# Patient Record
Sex: Female | Born: 2000 | Race: White | Hispanic: No | Marital: Single | Smoking: Never smoker
Health system: Southern US, Community
[De-identification: ages and names within clinical notes are randomized; demographics above are authoritative.]

## PROBLEM LIST (undated history)

## (undated) DIAGNOSIS — F419 Anxiety disorder, unspecified: Secondary | ICD-10-CM

## (undated) DIAGNOSIS — R5600 Simple febrile convulsions: Secondary | ICD-10-CM

---
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
## 2018-11-13 IMAGING — CR DG TOE 5TH 2+V*L*
3 series · 3 of 3 positions shown · non-contrast
Comparison: None.

CLINICAL DATA: Pain and swelling of the distal left fifth toe after
hitting it on a crib.

EXAM:
DG TOE 5TH LEFT

[t toes ap left]
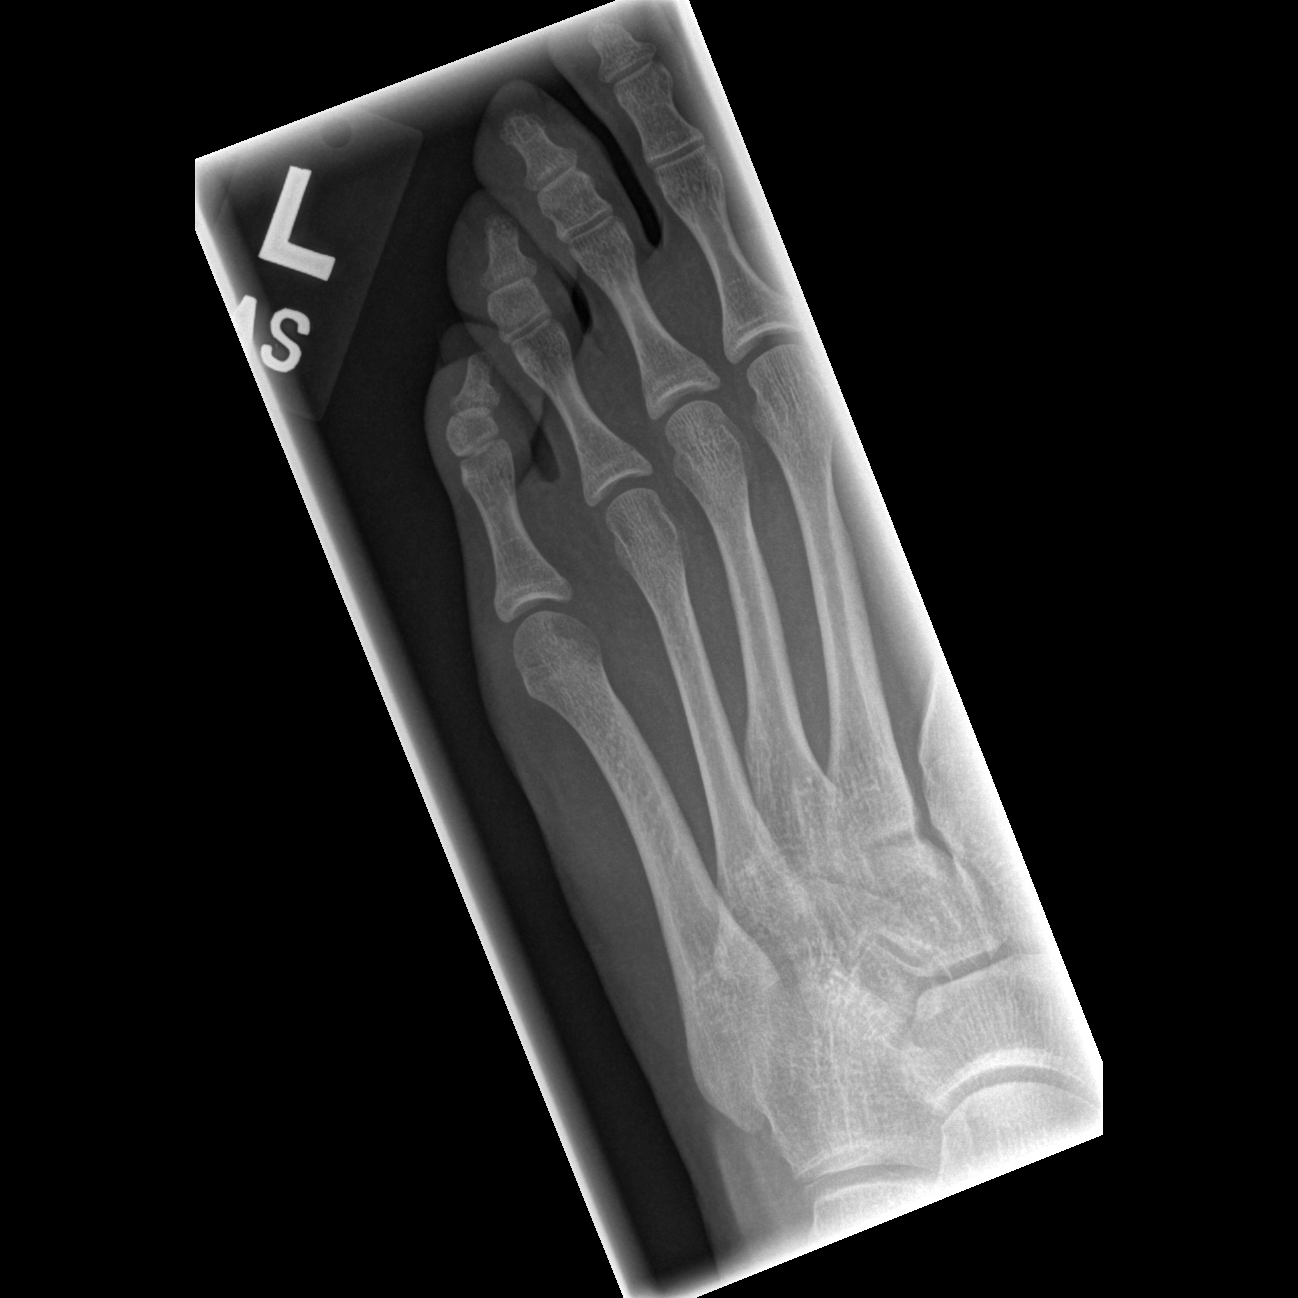

[t toes oblique left]
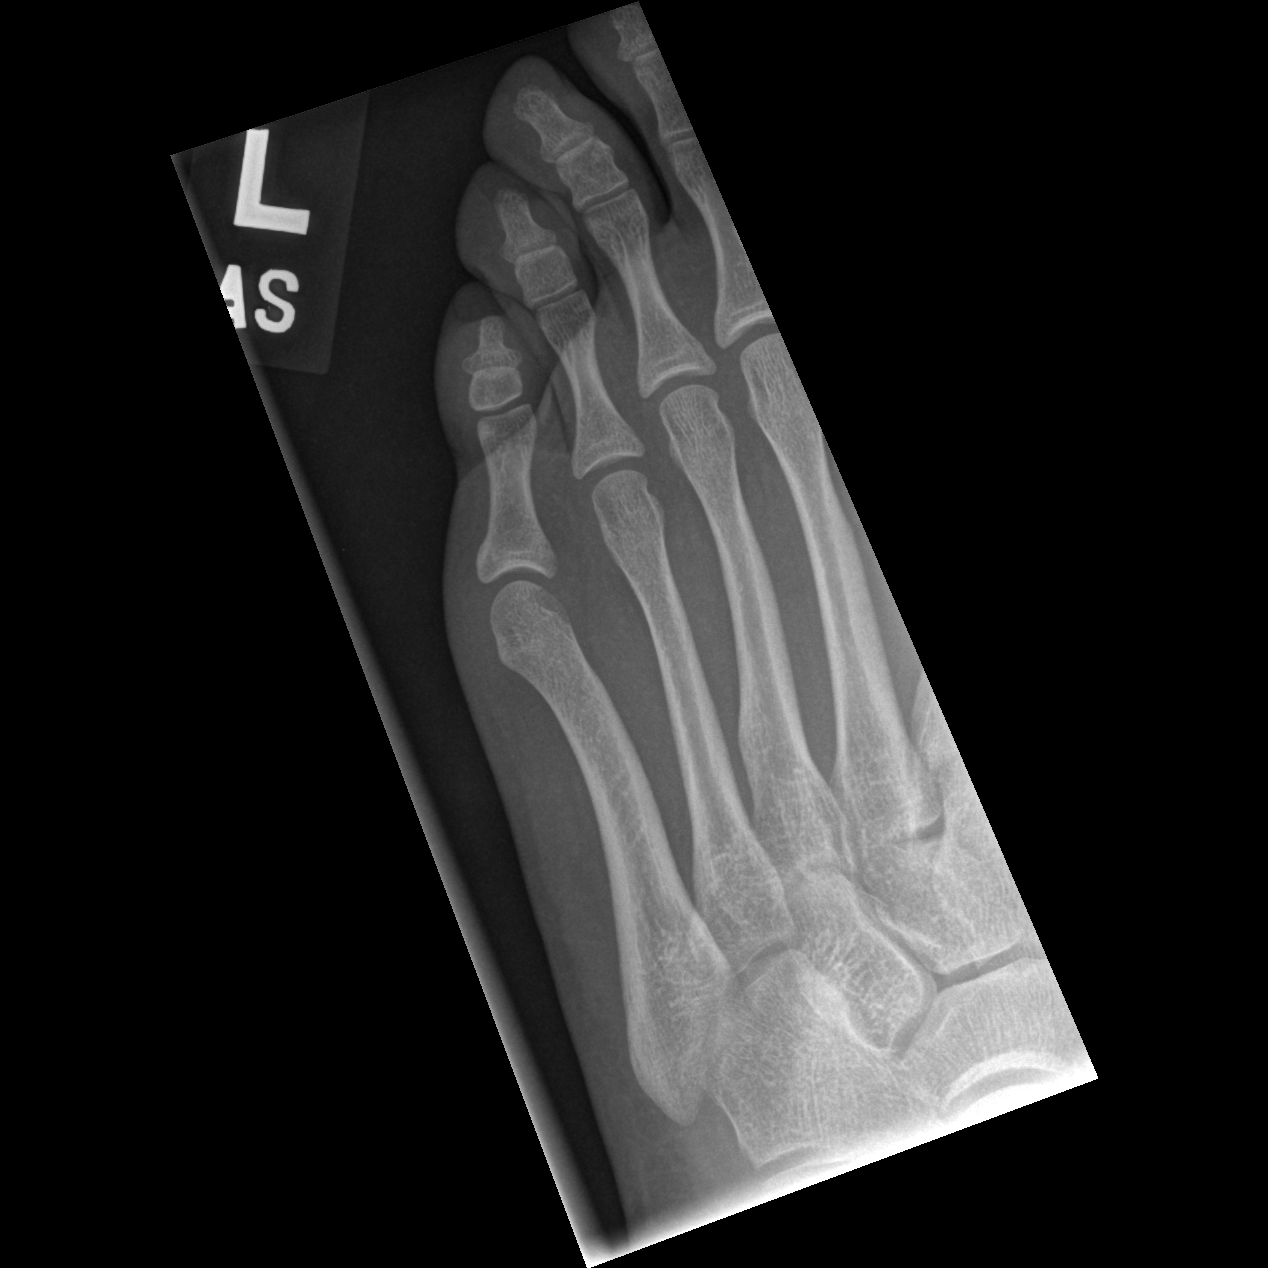

[t toes lateral left]
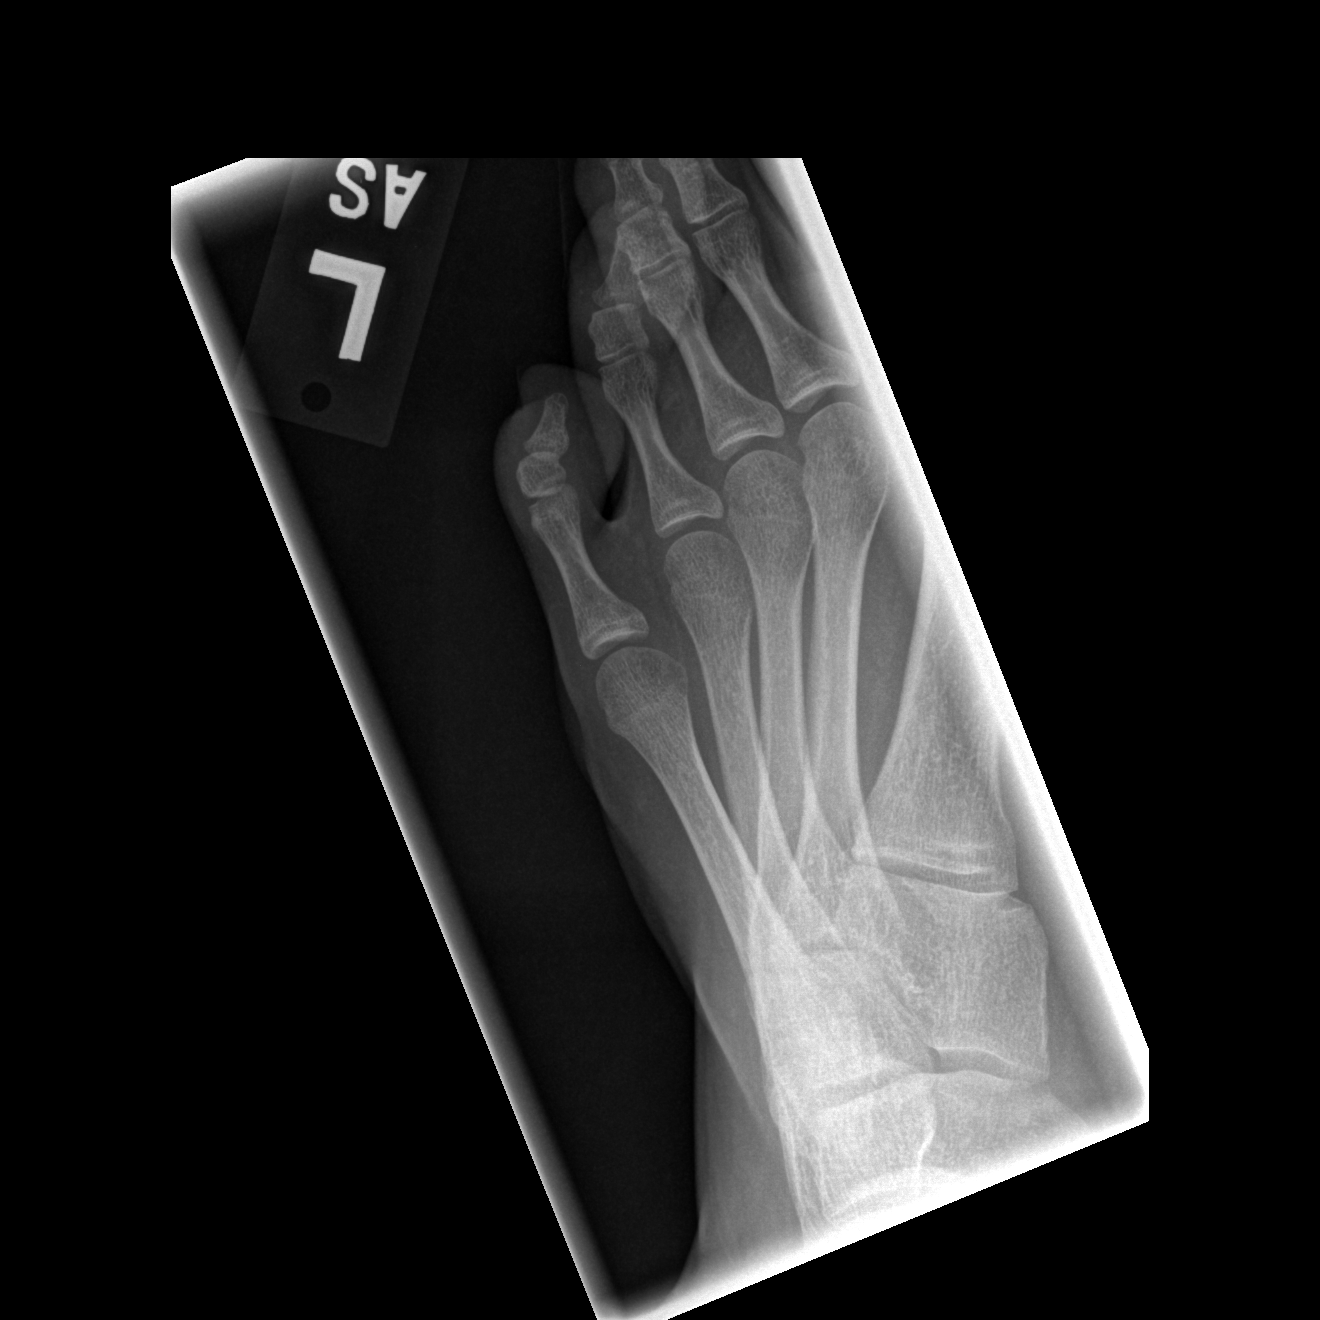

[3 of 3 positions shown; findings below may reference images not displayed]

FINDINGS: There is no evidence of fracture or dislocation. There is no
evidence of arthropathy or other focal bone abnormality. Soft
tissues are unremarkable.
IMPRESSION: Negative.

## 2019-09-16 ENCOUNTER — Ambulatory Visit (HOSPITAL_COMMUNITY)
Admission: EM | Admit: 2019-09-16 | Discharge: 2019-09-16 | Disposition: A | Discharge status: Home / Self Care | Payer: Medicaid Other | Attending: Family Medicine | Admitting: Family Medicine

## 2019-09-16 ENCOUNTER — Encounter (HOSPITAL_COMMUNITY): Payer: Self-pay

## 2019-09-16 ENCOUNTER — Other Ambulatory Visit: Payer: Self-pay

## 2019-09-16 DIAGNOSIS — R070 Pain in throat: Secondary | ICD-10-CM | POA: Diagnosis present

## 2019-09-16 DIAGNOSIS — Z20828 Contact with and (suspected) exposure to other viral communicable diseases: Secondary | ICD-10-CM | POA: Diagnosis not present

## 2019-09-16 DIAGNOSIS — J029 Acute pharyngitis, unspecified: Secondary | ICD-10-CM | POA: Diagnosis not present

## 2019-09-16 DIAGNOSIS — J3489 Other specified disorders of nose and nasal sinuses: Secondary | ICD-10-CM

## 2019-09-16 DIAGNOSIS — R0982 Postnasal drip: Secondary | ICD-10-CM | POA: Diagnosis not present

## 2019-09-16 MED ORDER — PSEUDOEPHEDRINE HCL 30 MG PO TABS: 30.0000 mg | ORAL_TABLET | Freq: Four times a day (QID) | ORAL | 0 refills | Status: AC | PRN

## 2019-09-16 MED ORDER — NAPROXEN 500 MG PO TABS: 500.0000 mg | ORAL_TABLET | Freq: Two times a day (BID) | ORAL | 0 refills | Status: DC

## 2019-09-16 MED ORDER — CETIRIZINE HCL 10 MG PO TABS: 10.0000 mg | ORAL_TABLET | Freq: Every day | ORAL | 0 refills | Status: DC

## 2019-09-16 NOTE — ED Notes (Signed)
Unable to obtain strep swab due to patient's hypersensitive gag reaction. Patient gagged and closed her mouth multiple times at just the thought of the swab in her throat. Will notify provider. COVID swab successfully obtained.

## 2019-09-16 NOTE — ED Triage Notes (Signed)
Patient presents to Urgent Care with complaints of sore thoat since last week. Patient reports no fever, has not taken any otc remedies.

## 2019-09-16 NOTE — Discharge Instructions (Signed)
You may pick up over-the-counter pseudoephedrine (Sudafed) and use this for post-nasal drainage, sinus congestion at a dose of 60mg  every 6 hours or 120mg  every 12 hours as needed. Take Zyrtec once daily as well.

## 2019-09-16 NOTE — ED Provider Notes (Signed)
[facility]   MRN: [id number] DOB: 2001/07/13  Subjective:   Janet Hamilton is a 18 y.o. female presenting for 1 week history of mild to moderate intermittent throat pain with postnasal drainage and stuffy nose. Has tried otc benadryl with minimal relief. Has a hx of allergies, does not take anything for them now.   No current facility-administered medications for this encounter.   Current Outpatient Medications:  .  dicyclomine (BENTYL) 20 MG tablet, Take 1 tablet (20 mg total) by mouth 2 (two) times daily., Disp: 20 tablet, Rfl: 0 .  omeprazole (PRILOSEC) 20 MG capsule, Take 20 mg by mouth daily., Disp: , Rfl:    No Known Allergies  Past Medical History:  Diagnosis Date  . Anxiety   . Febrile seizures ([place])    as an infant     History reviewed. No pertinent surgical history.  Family History  Problem Relation Age of Onset  . Healthy Mother   . Healthy Father     Social History   Tobacco Use  . Smoking status: Passive Smoke Exposure - Never Smoker  . Smokeless tobacco: Never Used  Substance Use Topics  . Alcohol use: No  . Drug use: No    Review of Systems  Constitutional: Negative for fever and malaise/fatigue.  HENT: Positive for congestion and sore throat. Negative for ear pain and sinus pain.   Eyes: Negative for discharge and redness.  Respiratory: Negative for cough, hemoptysis, shortness of breath and wheezing.   Cardiovascular: Negative for chest pain.  Gastrointestinal: Negative for abdominal pain, diarrhea, nausea and vomiting.  Genitourinary: Negative for dysuria, flank pain and hematuria.  Musculoskeletal: Negative for myalgias.  Skin: Negative for rash.  Neurological: Negative for dizziness, weakness and headaches.  Psychiatric/Behavioral: Negative for depression and substance abuse.     Objective:   Vitals: BP 128/88 (BP Location: Left Arm)   Pulse 98   Temp 97.8 F (36.6 C) (Oral)   Resp 15   SpO2 97%   Physical Exam  Constitutional:      General: She is not in acute distress.    Appearance: Normal appearance. She is well-developed. She is not ill-appearing.  HENT:     Head: Normocephalic and atraumatic.     Right Ear: Tympanic membrane and ear canal normal. No drainage or tenderness. No middle ear effusion. Tympanic membrane is not erythematous.     Left Ear: Tympanic membrane and ear canal normal. No drainage or tenderness.  No middle ear effusion. Tympanic membrane is not erythematous.     Nose: Nose normal. No congestion or rhinorrhea.     Mouth/Throat:     Mouth: Mucous membranes are moist. No oral lesions.     Pharynx: No pharyngeal swelling, oropharyngeal exudate, posterior oropharyngeal erythema or uvula swelling.     Tonsils: No tonsillar exudate or tonsillar abscesses.     Comments: Postnasal drainage. Eyes:     General: No scleral icterus.    Extraocular Movements: Extraocular movements intact.     Right eye: Normal extraocular motion.     Left eye: Normal extraocular motion.     Conjunctiva/sclera: Conjunctivae normal.     Pupils: Pupils are equal, round, and reactive to light.  Neck:     Musculoskeletal: Normal range of motion and neck supple.  Cardiovascular:     Rate and Rhythm: Normal rate.  Pulmonary:     Effort: Pulmonary effort is normal.  Lymphadenopathy:     Cervical: No cervical adenopathy.  Skin:  General: Skin is warm and dry.  Neurological:     General: No focal deficit present.     Mental Status: She is alert and oriented to person, place, and time.  Psychiatric:        Mood and Affect: Mood normal.        Behavior: Behavior normal.     Patient was unable to tolerate rapid strep swab.  Assessment and Plan :   1. Sore throat   2. Stuffy and runny nose   3. Post-nasal drainage     Recommended patient start Zyrtec and pseudoephedrine to address history of allergies, postnasal drainage as a source of her throat pain.  Patient unable to tolerate strep swab,  physical exam findings not consistent with strep throat.  Use supportive care for throat pain including naproxen. Counseled patient on potential for adverse effects with medications prescribed/recommended today, ER and return-to-clinic precautions discussed, patient verbalized understanding.    Wallis Bamberg, PA-C 09/16/19 1322

## 2019-09-18 LAB — NOVEL CORONAVIRUS, NAA (HOSP ORDER, SEND-OUT TO REF LAB; TAT 18-24 HRS): SARS-CoV-2, NAA: NOT DETECTED

## 2020-08-10 ENCOUNTER — Other Ambulatory Visit: Payer: Self-pay

## 2020-08-10 ENCOUNTER — Encounter (HOSPITAL_COMMUNITY): Payer: Self-pay | Admitting: *Deleted

## 2020-08-10 ENCOUNTER — Ambulatory Visit (HOSPITAL_COMMUNITY)
Admission: EM | Admit: 2020-08-10 | Discharge: 2020-08-10 | Disposition: A | Discharge status: Home / Self Care | Payer: Medicaid Other | Attending: Emergency Medicine | Admitting: Emergency Medicine

## 2020-08-10 DIAGNOSIS — J069 Acute upper respiratory infection, unspecified: Secondary | ICD-10-CM | POA: Insufficient documentation

## 2020-08-10 DIAGNOSIS — Z20822 Contact with and (suspected) exposure to covid-19: Secondary | ICD-10-CM | POA: Insufficient documentation

## 2020-08-10 MED ORDER — FLUTICASONE PROPIONATE 50 MCG/ACT NA SUSP: 1.0000 | Freq: Every day | NASAL | 0 refills | Status: AC

## 2020-08-10 MED ORDER — PSEUDOEPH-BROMPHEN-DM 30-2-10 MG/5ML PO SYRP: 10.0000 mL | ORAL_SOLUTION | Freq: Four times a day (QID) | ORAL | 0 refills | Status: AC | PRN

## 2020-08-10 MED ORDER — CETIRIZINE HCL 10 MG PO CAPS: 10.0000 mg | ORAL_CAPSULE | Freq: Every day | ORAL | 0 refills | Status: AC

## 2020-08-10 NOTE — ED Triage Notes (Signed)
Patient in with complaints of nasal congestion and productive cough x 4 days. Sputum is yellow in color and thick per patient.  Patient has taken Sudafed at home. Patient denies fever. Patient has received 1st COVID vaccination.

## 2020-08-10 NOTE — ED Provider Notes (Signed)
[facility]    CSN: [id number] Arrival date & time: 08/10/20  1356      History   Chief Complaint Chief Complaint  Patient presents with  . Nasal Congestion  . Cough    HPI Janet Hamilton is a 19 y.o. female presenting today for evaluation of URI symptoms.  Patient reports over the past 4 days she has had cough and congestion.  She has had a lot of mucus production that she has noted from her sinuses as well as her chest.  Denies fevers.  Has used Sudafed without relief.  Denies fevers.  Denies GI symptoms.  Denies close sick contacts.  Has received first dose of Covid vaccine.  HPI  Past Medical History:  Diagnosis Date  . Anxiety   . Febrile seizures ([facility])    as an infant    There are no problems to display for this patient.   History reviewed. No pertinent surgical history.  OB History   No obstetric history on file.      Home Medications    Prior to Admission medications   Medication Sig Start Date End Date Taking? Authorizing Provider  pseudoephedrine (SUDAFED) 30 MG tablet Take 1-2 tablets (30-60 mg total) by mouth every 6 (six) hours as needed for congestion. 09/16/19  Yes Wallis Bamberg, PA-C  brompheniramine-pseudoephedrine-DM 30-2-10 MG/5ML syrup Take 10 mLs by mouth 4 (four) times daily as needed. 08/10/20   Surafel Hilleary C, PA-C  Cetirizine HCl 10 MG CAPS Take 1 capsule (10 mg total) by mouth daily. 08/10/20   Denver Harder C, PA-C  fluticasone (FLONASE) 50 MCG/ACT nasal spray Place 1-2 sprays into both nostrils daily for 7 days. 08/10/20 08/17/20  Tyshawn Ciullo C, PA-C  dicyclomine (BENTYL) 20 MG tablet Take 1 tablet (20 mg total) by mouth 2 (two) times daily. 11/24/12 08/10/20  Rolan Bucco, MD  omeprazole (PRILOSEC) 20 MG capsule Take 20 mg by mouth daily.  08/10/20  [provider]    Family History Family History  Problem Relation Age of Onset  . Healthy Mother   . Healthy Father     Social History Social History     Tobacco Use  . Smoking status: Never Smoker  . Smokeless tobacco: Never Used  Vaping Use  . Vaping Use: Never used  Substance Use Topics  . Alcohol use: No  . Drug use: No     Allergies   Patient has no known allergies.   Review of Systems Review of Systems  Constitutional: Negative for activity change, appetite change, chills, fatigue and fever.  HENT: Positive for congestion, rhinorrhea and sinus pressure. Negative for ear pain, sore throat and trouble swallowing.   Eyes: Negative for discharge and redness.  Respiratory: Positive for cough. Negative for chest tightness and shortness of breath.   Cardiovascular: Negative for chest pain.  Gastrointestinal: Negative for abdominal pain, diarrhea, nausea and vomiting.  Musculoskeletal: Negative for myalgias.  Skin: Negative for rash.  Neurological: Negative for dizziness, light-headedness and headaches.     Physical Exam Triage Vital Signs ED Triage Vitals  Enc Vitals Group     BP      Pulse      Resp      Temp      Temp src      SpO2      Weight      Height      Head Circumference      Peak Flow      Pain  Score      Pain Loc      Pain Edu?      Excl. in GC?    No data found.  Updated Vital Signs BP 111/69 (BP Location: Left Arm)   Pulse 83   Temp 98.7 F (37.1 C) (Oral)   Resp 16   LMP 07/11/2020   SpO2 100%   Visual Acuity Right Eye Distance:   Left Eye Distance:   Bilateral Distance:    Right Eye Near:   Left Eye Near:    Bilateral Near:     Physical Exam Vitals and nursing note reviewed.  Constitutional:      Appearance: She is well-developed.     Comments: No acute distress  HENT:     Head: Normocephalic and atraumatic.     Ears:     Comments: Bilateral ears without tenderness to palpation of external auricle, tragus and mastoid, EAC's without erythema or swelling, TM's with good bony landmarks and cone of light. Non erythematous.     Nose: Nose normal.     Mouth/Throat:      Comments: Oral mucosa pink and moist, no tonsillar enlargement or exudate. Posterior pharynx patent and nonerythematous, no uvula deviation or swelling. Normal phonation. Eyes:     Conjunctiva/sclera: Conjunctivae normal.  Cardiovascular:     Rate and Rhythm: Normal rate.  Pulmonary:     Effort: Pulmonary effort is normal. No respiratory distress.     Comments: Breathing comfortably at rest, CTABL, no wheezing, rales or other adventitious sounds auscultated Abdominal:     General: There is no distension.  Musculoskeletal:        General: Normal range of motion.     Cervical back: Neck supple.  Skin:    General: Skin is warm and dry.  Neurological:     Mental Status: She is alert and oriented to person, place, and time.      UC Treatments / Results  Labs (all labs ordered are listed, but only abnormal results are displayed) Labs Reviewed  SARS CORONAVIRUS 2 (TAT 6-24 HRS)    EKG   Radiology No results found.  Procedures Procedures (including critical care time)  Medications Ordered in UC Medications - No data to display  Initial Impression / Assessment and Plan / UC Course  I have reviewed the triage vital signs and the nursing notes.  Pertinent labs & imaging results that were available during my care of the patient were reviewed by me and considered in my medical decision making (see chart for details).     URI symptoms x4 days with cough, lungs clear, exam reassuring, vital signs stable.  Covid test pending.  Suspect likely viral etiology and recommending symptomatic and supportive care rest and fluids.  Continue to monitor,Discussed strict return precautions. Patient verbalized understanding and is agreeable with plan.  Final Clinical Impressions(s) / UC Diagnoses   Final diagnoses:  Viral URI with cough     Discharge Instructions     Covid test pending, monitor my chart for results Daily cetirizine and Flonase to help with congestion sinus pressure and  drainage Cough syrup as needed or over-the-counter Robitussin Delsym or Dimetapp Ibuprofen and Tylenol as needed Rest and fluids    ED Prescriptions    Medication Sig Dispense Auth. Provider   fluticasone (FLONASE) 50 MCG/ACT nasal spray Place 1-2 sprays into both nostrils daily for 7 days. 1 g Destinae Neubecker C, PA-C   Cetirizine HCl 10 MG CAPS Take 1 capsule (10 mg  total) by mouth daily. 15 capsule Shakiya Mcneary C, PA-C   brompheniramine-pseudoephedrine-DM 30-2-10 MG/5ML syrup Take 10 mLs by mouth 4 (four) times daily as needed. 120 mL G[place]on Warzecha, Wharton C, PA-C     PDMP not reviewed this encounter.   Lew Dawes, PA-C 08/10/20 1616

## 2020-08-10 NOTE — Discharge Instructions (Signed)
Covid test pending, monitor my chart for results Daily cetirizine and Flonase to help with congestion sinus pressure and drainage Cough syrup as needed or over-the-counter Robitussin Delsym or Dimetapp Ibuprofen and Tylenol as needed Rest and fluids

## 2020-08-11 LAB — SARS CORONAVIRUS 2 (TAT 6-24 HRS): SARS Coronavirus 2: NEGATIVE
